# Patient Record
Sex: Female | Born: 1985 | Race: White | Hispanic: No | Marital: Single
Health system: Southern US, Community
[De-identification: ages and names within clinical notes are randomized; demographics above are authoritative.]

---

## 2013-08-30 ENCOUNTER — Emergency Department: Payer: Self-pay | Admitting: Emergency Medicine

## 2013-08-30 LAB — COMPREHENSIVE METABOLIC PANEL
Albumin: 3.8 g/dL (ref 3.4–5.0)
Alkaline Phosphatase: 176 U/L — ABNORMAL HIGH
Anion Gap: 7 (ref 7–16)
BILIRUBIN TOTAL: 0.6 mg/dL (ref 0.2–1.0)
BUN: 8 mg/dL (ref 7–18)
CHLORIDE: 105 mmol/L (ref 98–107)
Calcium, Total: 8.9 mg/dL (ref 8.5–10.1)
Co2: 25 mmol/L (ref 21–32)
Creatinine: 0.77 mg/dL (ref 0.60–1.30)
EGFR (African American): >60
EGFR (Non-African Amer.): >60
Glucose: 103 mg/dL — ABNORMAL HIGH (ref 65–99)
Osmolality: 272 (ref 275–301)
POTASSIUM: 3.3 mmol/L — AB (ref 3.5–5.1)
SGOT(AST): 18 U/L (ref 15–37)
SGPT (ALT): 24 U/L (ref 12–78)
Sodium: 137 mmol/L (ref 136–145)
TOTAL PROTEIN: 7.8 g/dL (ref 6.4–8.2)

## 2013-08-30 LAB — CBC WITH DIFFERENTIAL/PLATELET
Basophil #: 0 10*3/uL (ref 0.0–0.1)
Basophil %: 0.3 %
Eosinophil #: 0.2 10*3/uL (ref 0.0–0.7)
Eosinophil %: 2.2 %
HCT: 39.7 % (ref 35.0–47.0)
HGB: 13.1 g/dL (ref 12.0–16.0)
Lymphocyte #: 3.9 10*3/uL — ABNORMAL HIGH (ref 1.0–3.6)
Lymphocyte %: 34.5 %
MCH: 28.1 pg (ref 26.0–34.0)
MCHC: 33.1 g/dL (ref 32.0–36.0)
MCV: 85 fL (ref 80–100)
Monocyte #: 0.7 x10 3/mm (ref 0.2–0.9)
Monocyte %: 5.9 %
NEUTROS ABS: 6.5 10*3/uL (ref 1.4–6.5)
Neutrophil %: 57.1 %
PLATELETS: 426 10*3/uL (ref 150–440)
RBC: 4.68 10*6/uL (ref 3.80–5.20)
RDW: 13 % (ref 11.5–14.5)
WBC: 11.4 10*3/uL — ABNORMAL HIGH (ref 3.6–11.0)

## 2013-08-30 LAB — URINALYSIS, COMPLETE
BLOOD: NEGATIVE
Bacteria: NONE SEEN
Bilirubin,UR: NEGATIVE
Glucose,UR: NEGATIVE mg/dL (ref 0–75)
KETONE: NEGATIVE
Leukocyte Esterase: NEGATIVE
Nitrite: NEGATIVE
PROTEIN: NEGATIVE
Ph: 6 (ref 4.5–8.0)
RBC,UR: NONE SEEN /HPF (ref 0–5)
Specific Gravity: 1.02 (ref 1.003–1.030)

## 2013-09-09 ENCOUNTER — Ambulatory Visit: Payer: Self-pay | Admitting: Gastroenterology

## 2013-09-20 ENCOUNTER — Ambulatory Visit: Payer: Self-pay | Admitting: Gastroenterology

## 2013-09-21 LAB — PATHOLOGY REPORT

## 2013-09-24 ENCOUNTER — Emergency Department: Payer: Self-pay | Admitting: Internal Medicine

## 2013-09-24 LAB — COMPREHENSIVE METABOLIC PANEL
ALBUMIN: 3.8 g/dL (ref 3.4–5.0)
Alkaline Phosphatase: 167 U/L — ABNORMAL HIGH
Anion Gap: 7 (ref 7–16)
BUN: 6 mg/dL — AB (ref 7–18)
Bilirubin,Total: 0.4 mg/dL (ref 0.2–1.0)
CHLORIDE: 108 mmol/L — AB (ref 98–107)
Calcium, Total: 9 mg/dL (ref 8.5–10.1)
Co2: 25 mmol/L (ref 21–32)
Creatinine: 0.88 mg/dL (ref 0.60–1.30)
EGFR (African American): >60
Glucose: 87 mg/dL (ref 65–99)
OSMOLALITY: 276 (ref 275–301)
Potassium: 3.5 mmol/L (ref 3.5–5.1)
SGOT(AST): 25 U/L (ref 15–37)
SGPT (ALT): 28 U/L (ref 12–78)
Sodium: 140 mmol/L (ref 136–145)
Total Protein: 8 g/dL (ref 6.4–8.2)

## 2013-09-24 LAB — URINALYSIS, COMPLETE
BILIRUBIN, UR: NEGATIVE
BLOOD: NEGATIVE
Glucose,UR: NEGATIVE mg/dL (ref 0–75)
Ketone: NEGATIVE
Nitrite: NEGATIVE
Ph: 7 (ref 4.5–8.0)
Protein: NEGATIVE
Specific Gravity: 1.005 (ref 1.003–1.030)
Squamous Epithelial: 4

## 2013-09-24 LAB — HCG, QUANTITATIVE, PREGNANCY: Beta Hcg, Quant.: <1 m[IU]/mL — ABNORMAL LOW

## 2013-09-24 LAB — CBC WITH DIFFERENTIAL/PLATELET
Basophil #: 0 10*3/uL (ref 0.0–0.1)
Basophil %: 0.4 %
Eosinophil #: 0.2 10*3/uL (ref 0.0–0.7)
Eosinophil %: 2.6 %
HCT: 41.2 % (ref 35.0–47.0)
HGB: 13.5 g/dL (ref 12.0–16.0)
LYMPHS ABS: 2.7 10*3/uL (ref 1.0–3.6)
LYMPHS PCT: 28 %
MCH: 27.7 pg (ref 26.0–34.0)
MCHC: 32.8 g/dL (ref 32.0–36.0)
MCV: 84 fL (ref 80–100)
MONO ABS: 0.8 x10 3/mm (ref 0.2–0.9)
MONOS PCT: 8.1 %
NEUTROS ABS: 5.8 10*3/uL (ref 1.4–6.5)
Neutrophil %: 60.9 %
PLATELETS: 449 10*3/uL — AB (ref 150–440)
RBC: 4.88 10*6/uL (ref 3.80–5.20)
RDW: 13.3 % (ref 11.5–14.5)
WBC: 9.5 10*3/uL (ref 3.6–11.0)

## 2013-09-24 LAB — LIPASE, BLOOD: Lipase: 182 U/L (ref 73–393)

## 2014-07-23 NOTE — Consult Note (Signed)
PATIENT NAME:  Erika Jackson, Erika Jackson MR#:  [id number] DATE OF BIRTH:  26-Oct-1985  DATE OF CONSULTATION:  09/24/2013  CONSULTING PHYSICIAN:  Shaune PollackQing Chen, MD  PRIMARY CARE PHYSICIAN: Nonlocal.  REFERRING PHYSICIAN: Dr. Mindi JunkerGottlieb.  REASON FOR CONSULTATION: Abdominal pain, 3 weeks, worsening today.  REVIEW OF HISTORY: The patient is a 29 year old Caucasian female with a history of endometriosis who presented to the ED with abdominal pain for 3 weeks, worsening today. The patient is alert, awake, oriented, in no acute distress. According to the patient, the patient has had abdominal pain in the right lower quadrant for the past 3 weeks with some nausea, vomiting. The abdominal pain has been intermittent, dull, sometimes sharp, sometimes radiation to back. The patient denies any diarrhea. The patient got an endoscopy and a colonoscopy by Dr. Bluford Kaufmannh recently. The patient denies any fever or chills. No headache or dizziness. So far, according to Dr. Sanjuana LettersGottlieb's workup so far are negative. Since the patient has intractable abdominal pain, hospitalist is requested for consult.  PAST MEDICAL HISTORY: Endometriosis.   SURGICAL HISTORY: Appendectomy and laparoscopy for endometriosis.   SOCIAL HISTORY: No smoking, alcohol abuse, or illicit drugs.   FAMILY HISTORY: Father had a heart attack. Mother and grandmother had endometriosis.   ALLERGIES: TO AMOXICILLIN, BACTRIM.   HOME MEDICATIONS:  1. Singulair 10 mg p.o. daily.  2. Zofran 4 mg every 8 hours p.r.n. 3. Meloxicam 15 mg p.o. once a day p.r.n. 4. Nexplanon 68 mg subcutaneous once a day for 3 years.  5. Percocet 325 mg/5 mg p.o. every 6 hours.   REVIEW OF HISTORY: CONSTITUTIONAL: Patient denies any fever or chills. No headache or dizziness. No weakness.  EYES: No double vision, blurred vision.  ENT: No postnasal drip, slurred speech, or dysphagia.  CARDIOVASCULAR: No chest pain, palpitation, orthopnea, or nocturnal dyspnea, no leg edema.  PULMONARY: No  cough, sputum, shortness of breath, or hematemesis.  GASTROINTESTINAL: Positive for abdominal pain in the right lower quadrant with nausea, vomiting. No diarrhea. No melena or bloody stool.  GENITOURINARY: No dysuria, hematuria, or incontinence.  SKIN: No rash or jaundice.  NEUROLOGY: No syncope, loss of consciousness, or seizure.  ENDOCRINOLOGY: No polyuria, polydipsia, heat or cold intolerance.  HEMATOLOGY: No easy bleeding or bleeding.   PHYSICAL EXAMINATION:  VITAL SIGNS: Temperature 98.7, blood pressure 123/74, pulse 95, oxygen saturation 100% on room air.  GENERAL: The patient is alert, awake, oriented, in no acute distress.  EYES: Pupils round, equal and reactive to light and accommodation.  NECK: Supple. No JVD or carotid bruit. No lymphadenopathy. No thyromegaly.  ENT: Moist oral mucosa. Clear pharynx. CARDIOVASCULAR: S1, S2 regular rate and rhythm. No murmurs or gallops.  PULMONARY: Bilateral air entry. No wheezing or rales. No use of accessory muscle to breathe.  ABDOMEN: Soft, obese. No distention. There is tenderness on the right lower quadrant. No rigidity, no rebound. No organomegaly. Bowel sounds present.  EXTREMITIES: No edema, clubbing or cyanosis. No calf tenderness. Bilateral pedal pulses present. SKIN: No rash, jaundice. NEUROLOGY: A and O x 3. No focal deficit. Power 5/5, sensory intact.    LABORATORY DATA:  1. Femur x-ray shows small focus of peritoneal new bone formation along with medial shaft of the left femur. Most suggestive of stress reaction.  2. Pelvis ultrasound showed normal pelvic ultrasound. 3. Abdominal and pelvis CAT scan showed no acute abdominal process.  4. Abdominal 3-way negative. No acute cardiopulmonary disease.  5. Urinalysis is negative. CBC in normal range. Glucose 87, BUN 6,  creatinine 0.88. Electrolytes are normal. Lipase 182, hCG less than one.   IMPRESSIONS:  1. Intractable abdominal pain, possibly due to endometriosis.  2. Obesity.    RECOMMENDATIONS: Since the patient is having intractable abdominal pain, Dr. Mindi Junker called hospitalist for admission. I examined the patient and discussed the patient's condition and plan of treatment with the patient. I suggested pain control and a gynecological consult because the intractable abdominal pain is possibly related to endometriosis. The patient changed her mind. She wants to go home and follow up with gynecology as an outpatient. Dr. Mindi Junker requested a consult. I discussed the patient's condition and plan of treatment with the patient and the patient's family member. Discussed with Dr. Mindi Junker.   TIME SPENT: About 50 minutes.     ____________________________ Shaune Pollack, MD qc:lt D: 09/24/2013 21:29:00 ET T: 09/25/2013 04:54:40 ET JOB#: [id number]  cc: Shaune Pollack, MD, <Dictator> Shaune Pollack MD ELECTRONICALLY SIGNED 09/28/2013 11:52

## 2014-07-23 NOTE — H&P (Signed)
   Subjective/Chief Complaint RLQ pain x 1 month, Bloating, H/o endometriosis s/p laparoscopy, s/p appendectomy   History of Present Illness Ms. Erika Jackson is a pleasant 29 yo F with a history of what she describes as extensive endometriosis s/p laparoscopy with removal of implants and controlled with hormonal implants and s/p appendectomy in 2010 who presents with 1 month of worsening RLQ pain.  Initially presented earlier this month and found to have enlarged LN around TI.  Has had unremarkable colonoscopy and EGD.  Tolerating diet with some nausea.  No constipation/diarrhea.  Initially bloated but now improved.  Feels pain is similar to prior OB related pain   Past History Endometriosis s/p laparoscopy 2008 Appendicitis s/p appendectomy 2010   Past Med/Surgical Hx:  seasonal allergies:   endometriosis:   ALLERGIES:  Amoxicillin: Rash  Bactrim: Swelling  Family and Social History:  Family History Coronary Artery Disease  Cancer  Stroke   Social History negative tobacco, positive ETOH, Social EtOH   Place of Living Home   Review of Systems:  Subjective/Chief Complaint RLQ pain   Fever/Chills No   Cough No   Sputum No   Abdominal Pain Yes   Diarrhea No   Constipation No   Nausea/Vomiting No   SOB/DOE No   Chest Pain No   Dysuria No   Tolerating Diet Yes   Physical Exam:  GEN well developed, well nourished, no acute distress   HEENT pink conjunctivae, pale conjunctivae, PERRL   RESP normal resp effort  clear BS  no use of accessory muscles   CARD regular rate  no murmur  no thrills   ABD positive tenderness  soft  distended  normal BS  Mild RLQ tenderness, no peritonitis   LYMPH negative neck, negative axillae   SKIN normal to palpation, No rashes, No ulcers   NEURO cranial nerves intact, negative rigidity, negative tremor   PSYCH A+O to time, place, person, good insight    Assessment/Admission Diagnosis Ms. Erika Jackson is a pleasant 29 yo with RLQ  pain and negative workup.  H/o appendectomy and no CT findings consistent with stump appendicitis (no WBC also).  My inclinination is that this pain may be obstetric related with history of OB related pain.   Plan No acute surgical issues.   Electronic Signatures: Jarvis NewcomerLundquist, Zhion Pevehouse A (MD)  (Signed 26-Jun-15 20:38)  Authored: CHIEF COMPLAINT and HISTORY, PAST MEDICAL/SURGIAL HISTORY, ALLERGIES, FAMILY AND SOCIAL HISTORY, REVIEW OF SYSTEMS, PHYSICAL EXAM, ASSESSMENT AND PLAN   Last Updated: 26-Jun-15 20:38 by Jarvis NewcomerLundquist, Eesha Schmaltz A (MD)

## 2015-01-31 IMAGING — NM NUCLEAR MEDICINE WHOLE BODY BONE SCINTIGRAPHY
2 series · 4 of 4 positions shown · non-contrast
Comparison: CT abdomen/pelvis 08/30/2013.

CLINICAL DATA: Generalized joint pain with abdominal and bone pain.
Remote history left elbow fracture. Recent CT demonstrates a few
tiny sclerotic foci over the left pelvis in bilateral femoral heads.

EXAM:
NUCLEAR MEDICINE WHOLE BODY BONE SCAN
TECHNIQUE: Whole body anterior and posterior images were obtained approximately
3 hours after intravenous injection of radiopharmaceutical.
RADIOPHARMACEUTICALS:  22.25 mCi Bechnetium-II MDP

[Series 1000: statics · 2.40mm/px · 2 of 2 frames shown]
[frame 1/2]
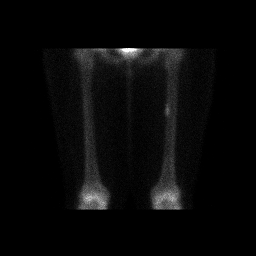
[frame 2/2]
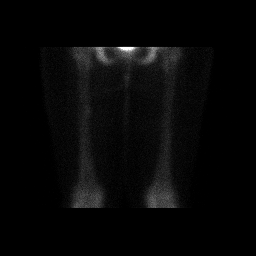

[Series 1000: 3 hr wholebody · 2.40mm/px · 2 of 2 frames shown]
[frame 1/2]
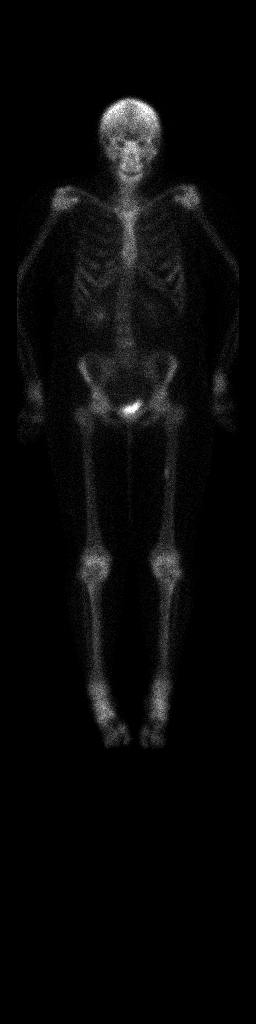
[frame 2/2]
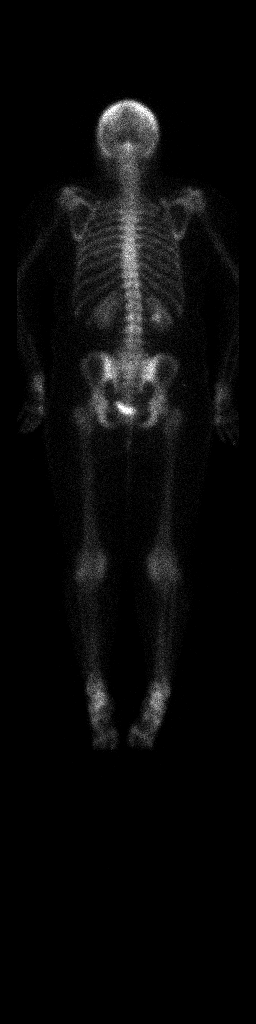

[4 of 4 positions shown; findings below may reference images not displayed]

FINDINGS: Examination demonstrates normal biodistribution of radiotracer
uptake within the bones, soft tissues and genitourinary system.
There is a single mild increased focus of radiotracer activity over
the medial cortex of the left mid femoral diaphysis on the anterior
view. There is no abnormal uptake over the pelvis/ hips. Remainder
of the exam is unremarkable.
IMPRESSION: No definite evidence metastatic disease. Small sclerotic foci over
the left pelvis and bilateral femoral heads are compatible with bone
islands.

Nonspecific single focus of mild increased radiotracer activity over
the medial cortex of the left mid femoral diaphysis. Recommend plain
film correlation as findings may be related to trauma/stress
reaction, infection or neoplastic disease.

## 2015-02-15 IMAGING — US TRANSABDOMINAL ULTRASOUND OF PELVIS
1 series · 14 of 25 positions shown · non-contrast
Comparison: August 30, 2013.

CLINICAL DATA: Pelvic pain.

EXAM:
TRANSABDOMINAL ULTRASOUND OF PELVIS
TECHNIQUE: Transabdominal ultrasound examination of the pelvis was performed
including evaluation of the uterus, ovaries, adnexal regions, and
pelvic cul-de-sac.

[Series 1: transabdominal ultrasound of pelvis · 0.27mm/px · 14 of 30 slices shown]
[im 1/30]
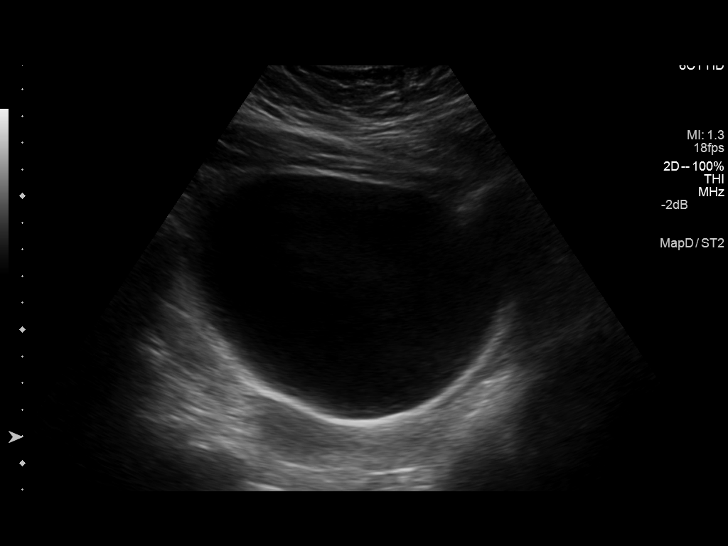
[im 3/30]
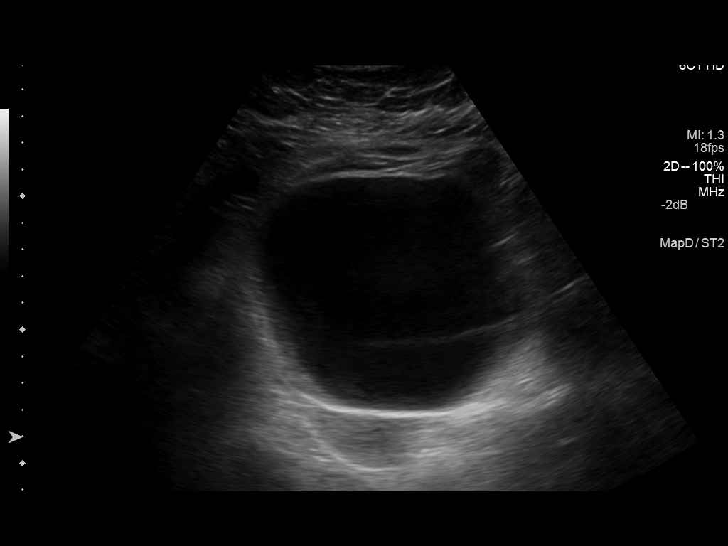
[im 5/30]
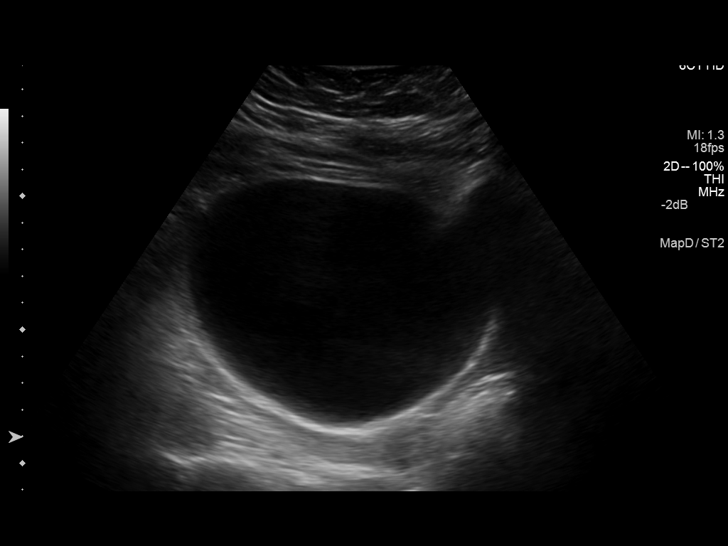
[im 8/30]
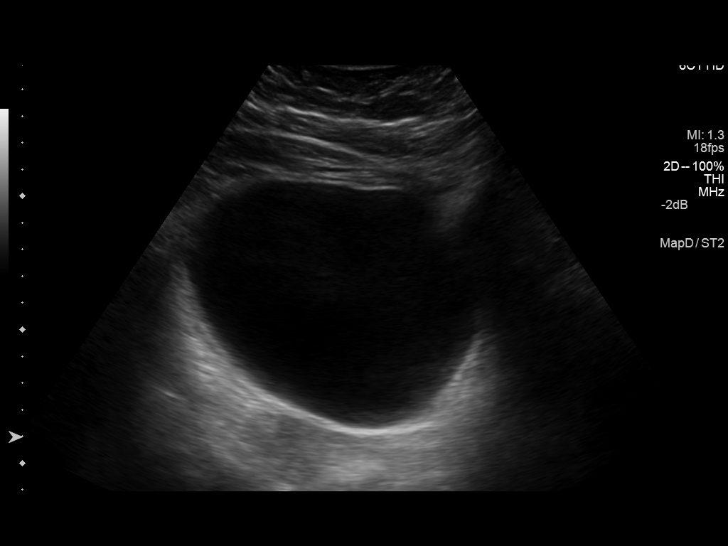
[im 10/30]
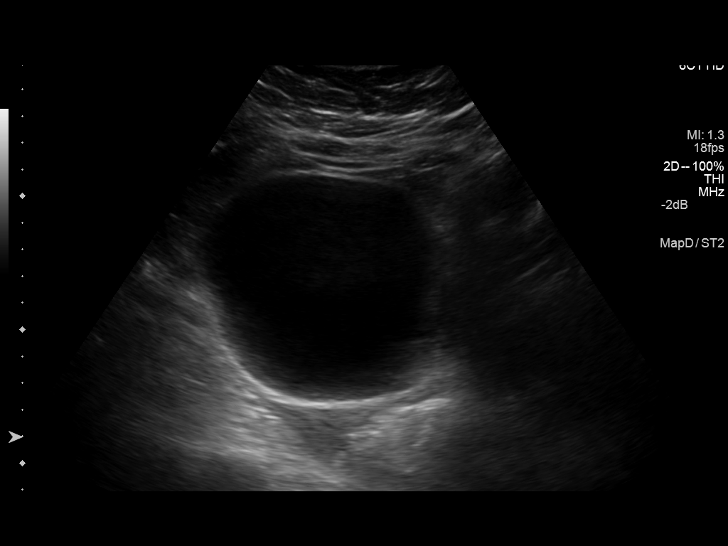
[im 11/30]
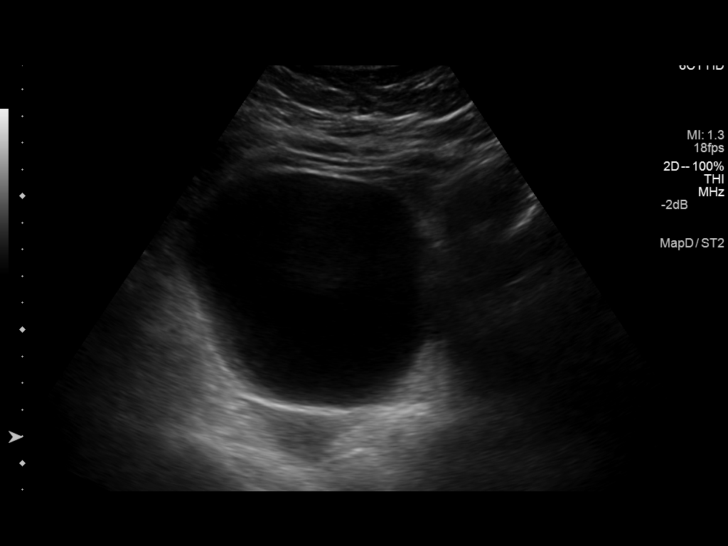
[im 14/30]
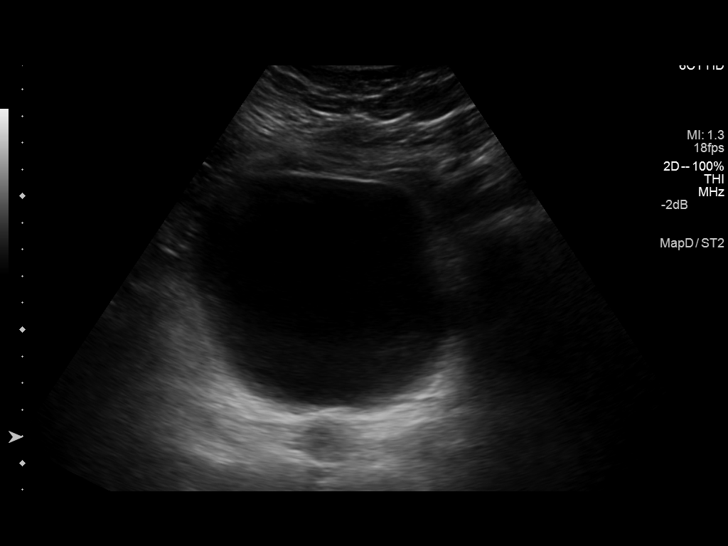
[im 16/30]
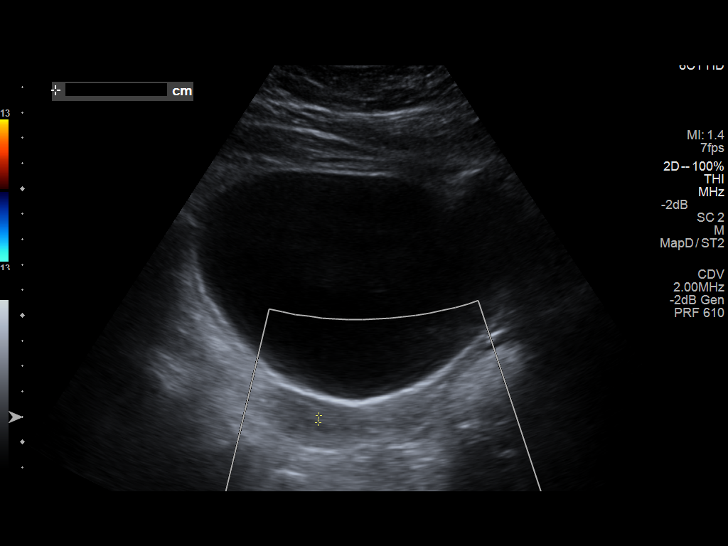
[im 19/30]
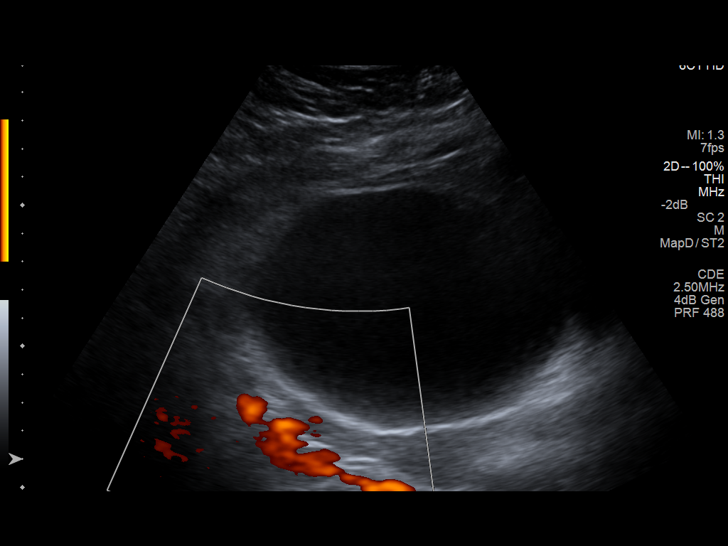
[im 20/30]
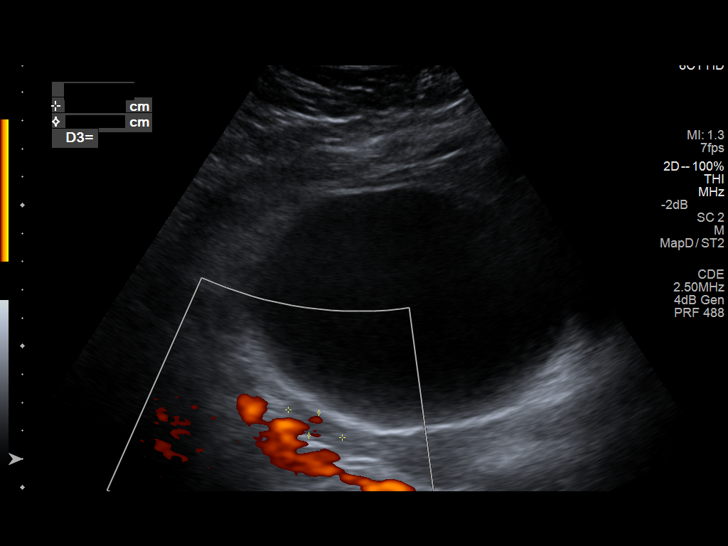
[im 22/30]
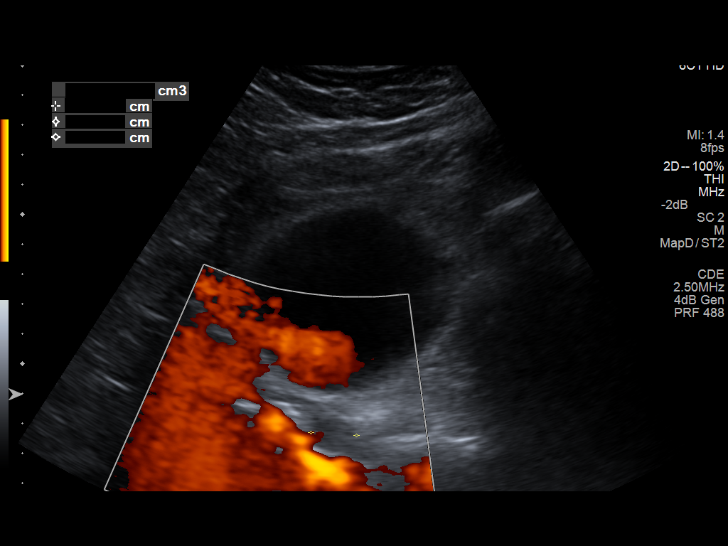
[im 25/30]
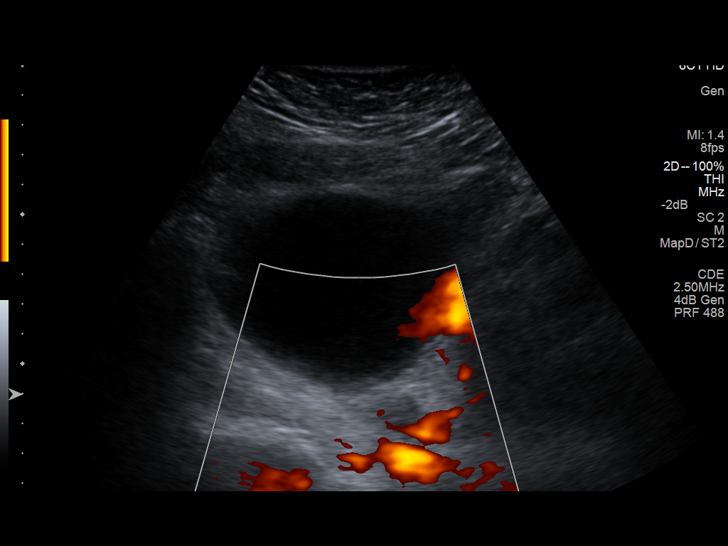
[im 27/30]
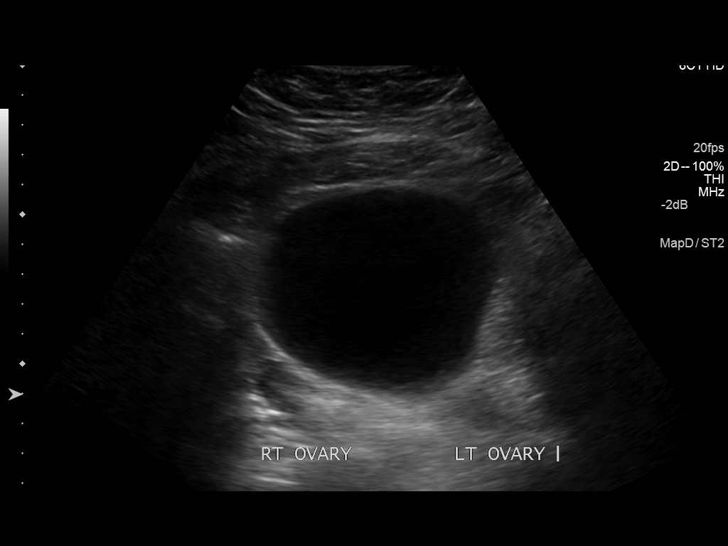
[im 30/30]
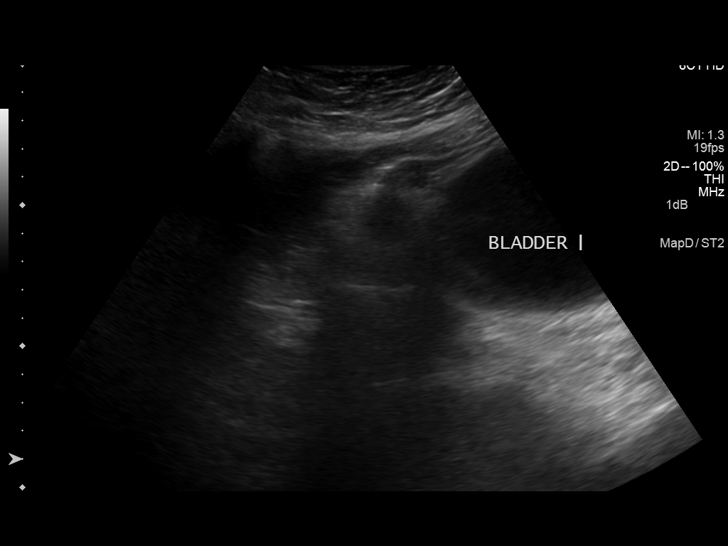

[14 of 25 positions shown; findings below may reference images not displayed]

FINDINGS: Uterus

Measurements: 6.9 x 3.2 x 1.7 cm. No fibroids or other mass
visualized.

Endometrium

Thickness: 3.2 mm.  No focal abnormality visualized.

Right ovary

Measurements: 2.2 x 1.5 x 0.9 cm. Normal appearance/no adnexal mass.

Left ovary

Measurements: 2.2 x 1.4 x 1.3 cm. Normal appearance/no adnexal mass.

Other findings:  No free fluid
IMPRESSION: Normal pelvic ultrasound.

## 2015-02-15 IMAGING — CR DG ABDOMEN 3V
1 series · 3 of 3 positions shown · non-contrast
Comparison: CT 08/30/2013

CLINICAL DATA: Right lower quadrant pain.  Nausea.

EXAM:
ABDOMEN SERIES

[Series 1: w chest pa · 0.14mm/px · 3 of 3 slices shown]
[im 1/3]
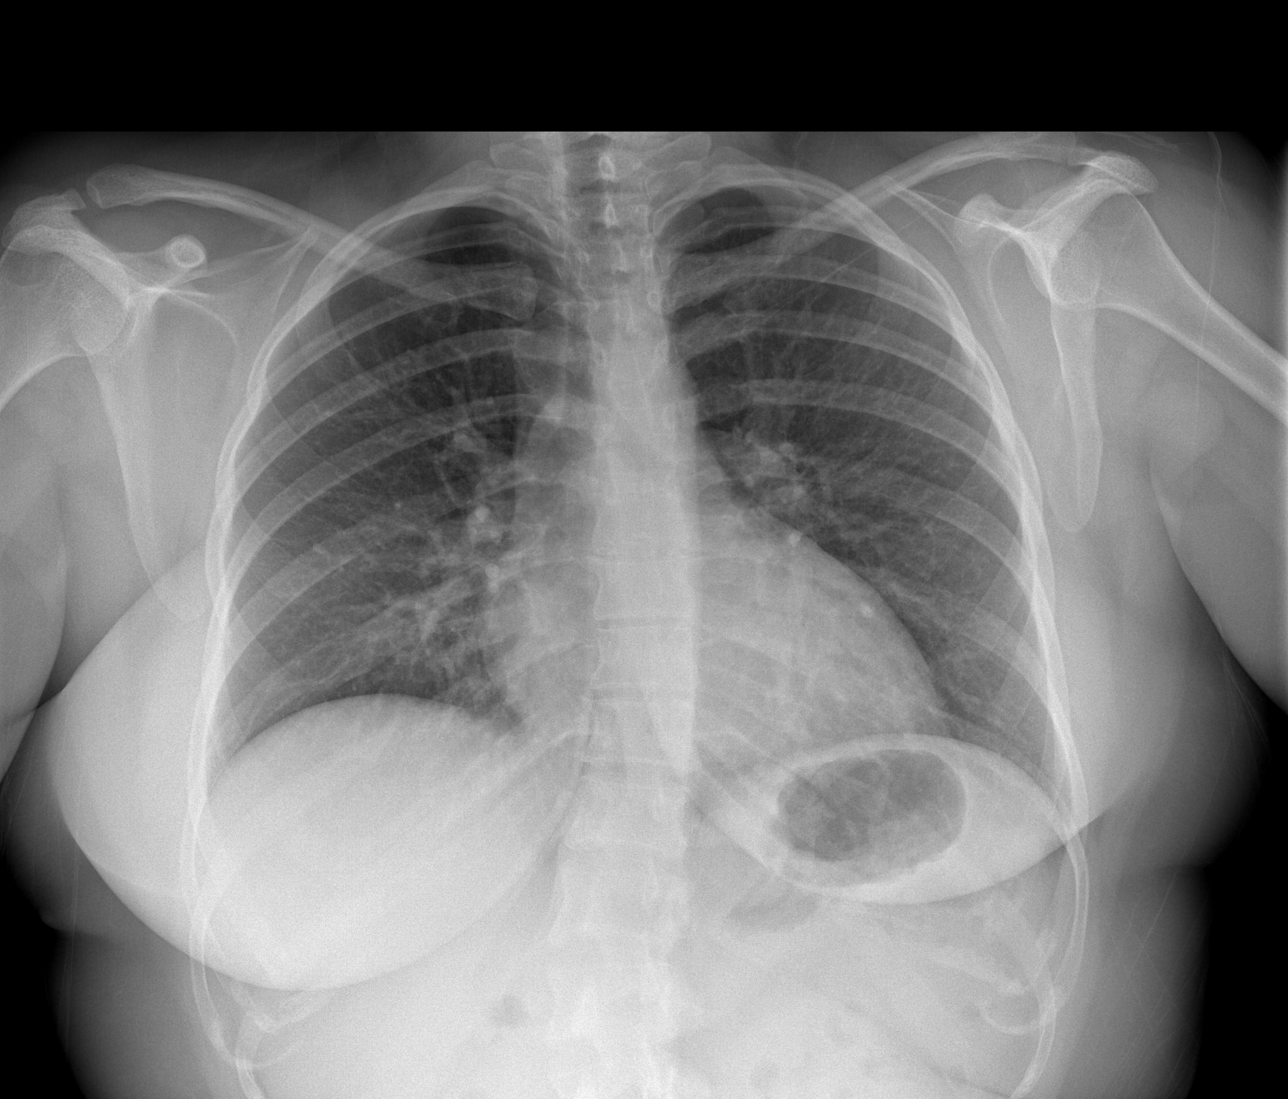
[im 2/3]
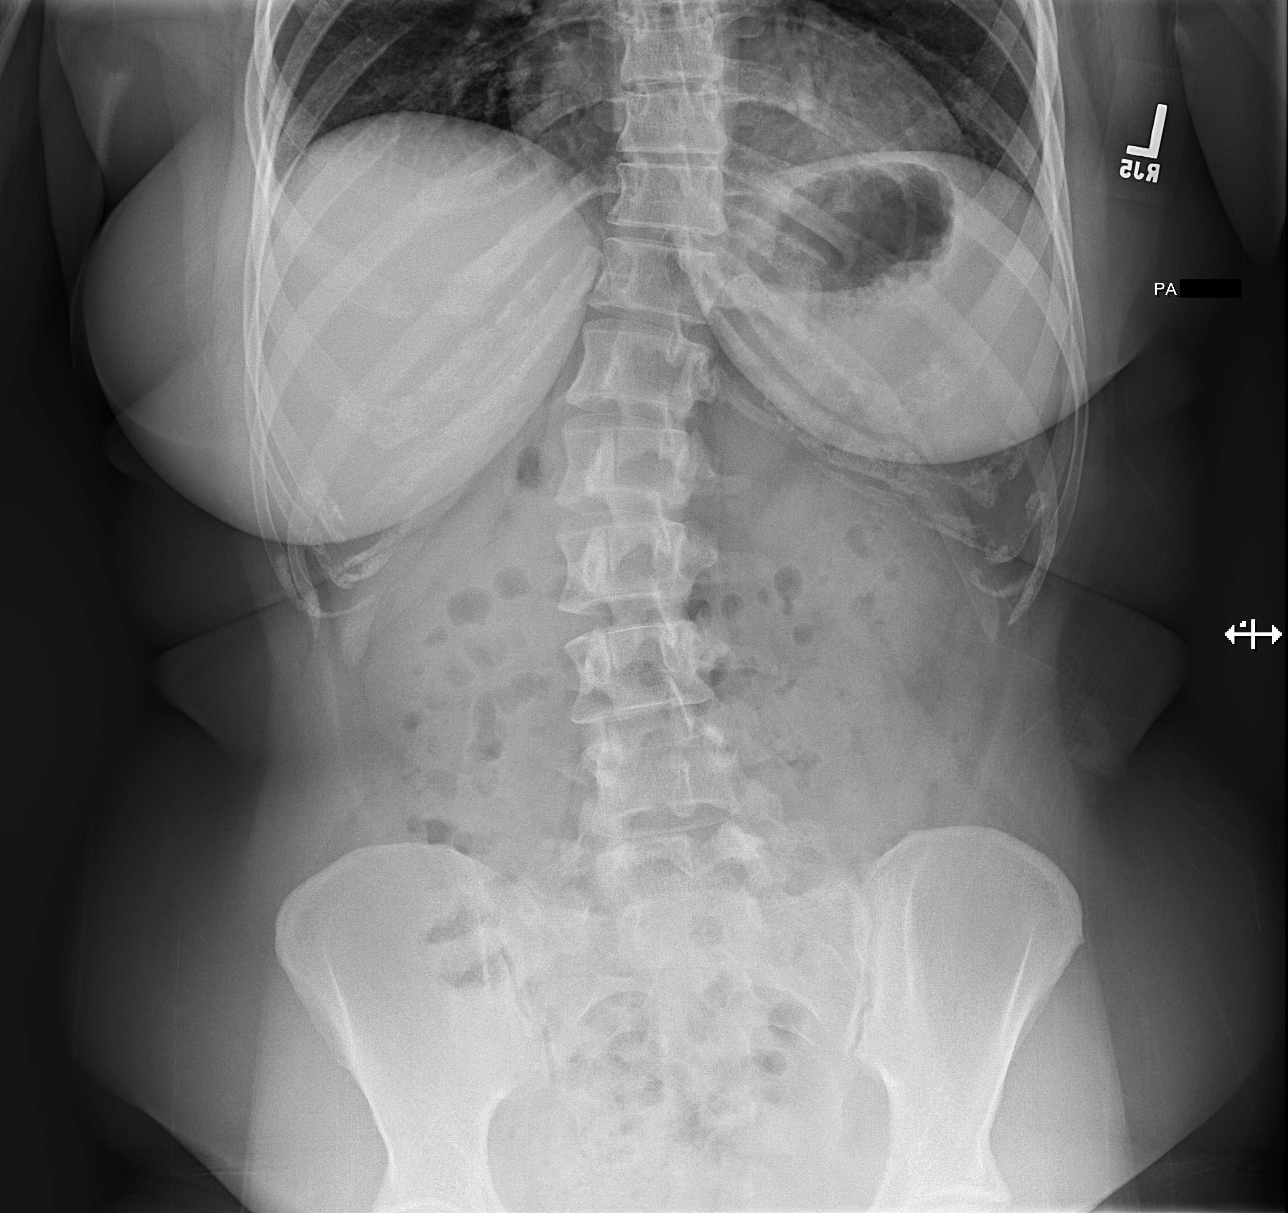
[im 3/3]
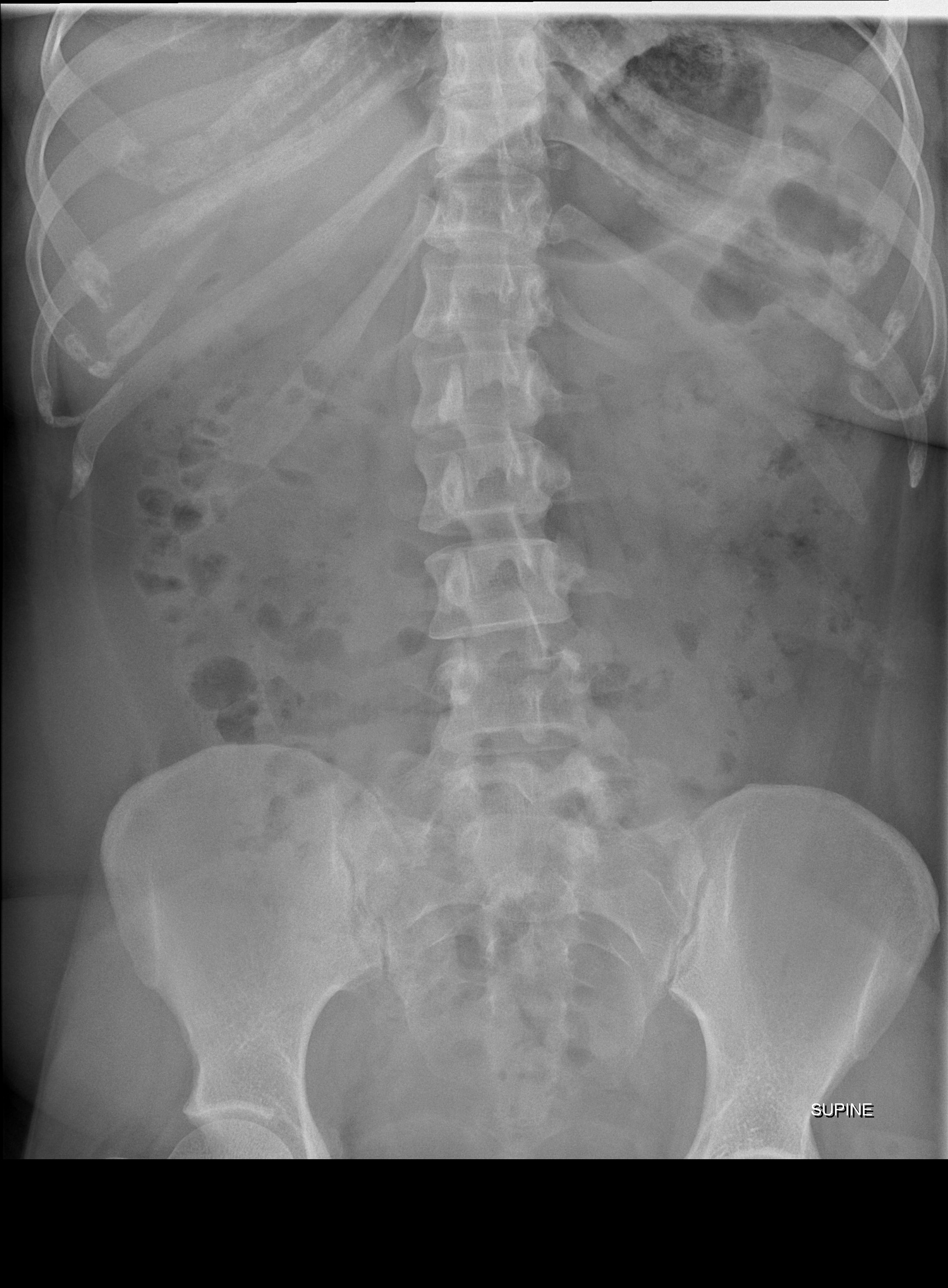

[3 of 3 positions shown; findings below may reference images not displayed]

FINDINGS: Lungs are somewhat hypoinflated without consolidation or effusion.
Cardiomediastinal silhouette and remainder of the thorax is
unremarkable

Abdominal images demonstrate a nonspecific, nonobstructive bowel gas
pattern. There is no free air. There subtle biphasic curvature of
the thoracolumbar spine.
IMPRESSION: Negative abdominal radiographs.  No acute cardiopulmonary disease.

## 2015-02-15 IMAGING — CR DG FEMUR 2V*L*
1 series · 4 of 4 positions shown · non-contrast
Comparison: Bone scan 09/09/2013

CLINICAL DATA: Abnormal bone scan.

EXAM:
LEFT FEMUR - 2 VIEW

[Series 4: t femur proximal ap left · 0.14mm/px · 4 of 4 slices shown]
[im 1/4]
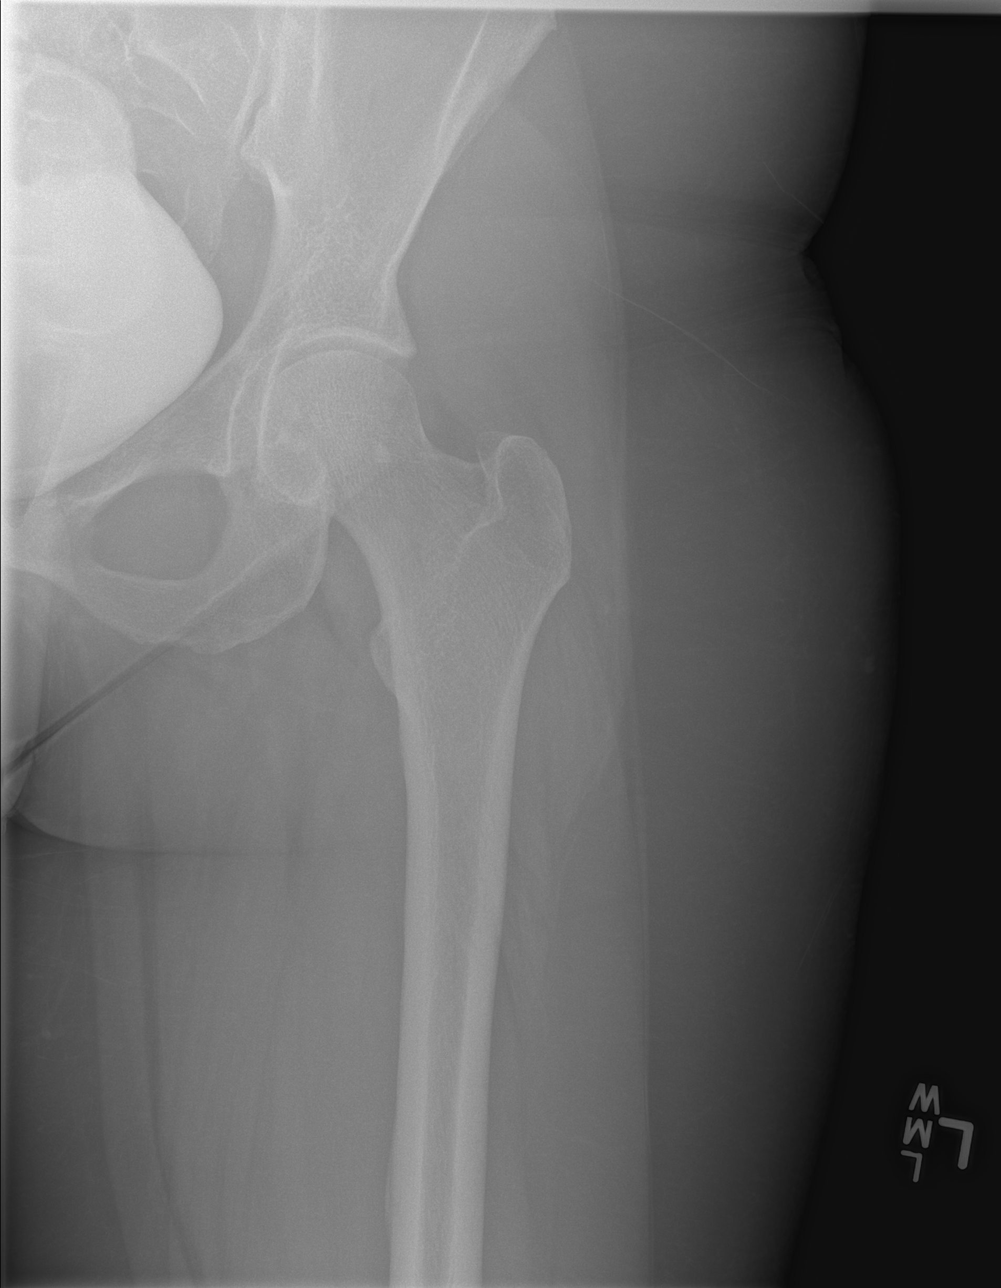
[im 2/4]
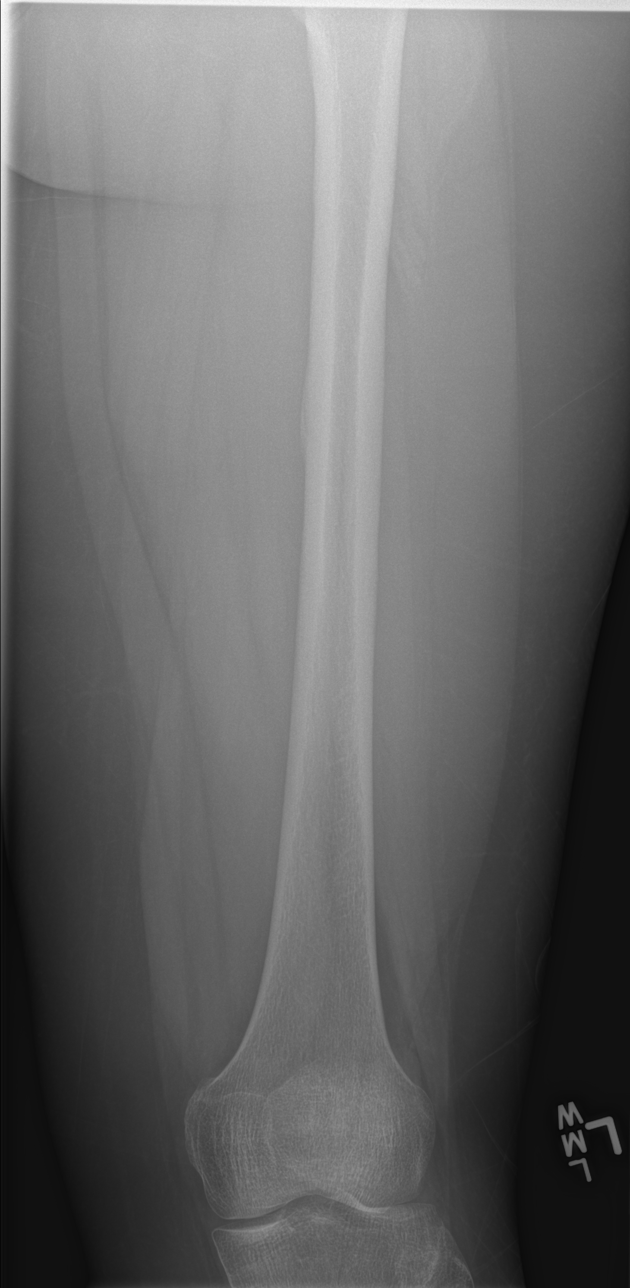
[im 3/4]
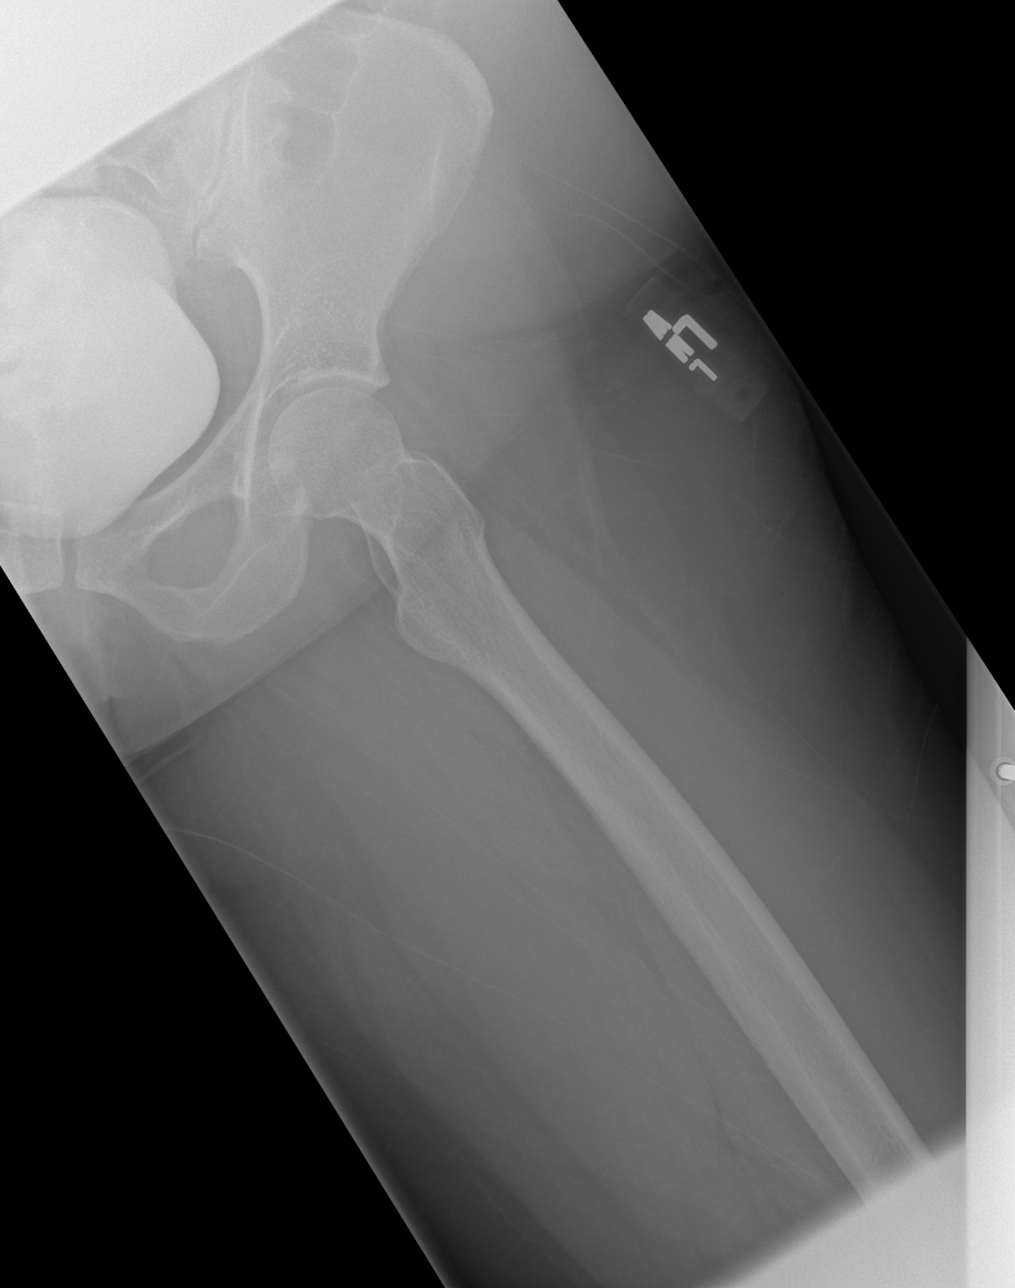
[im 4/4]
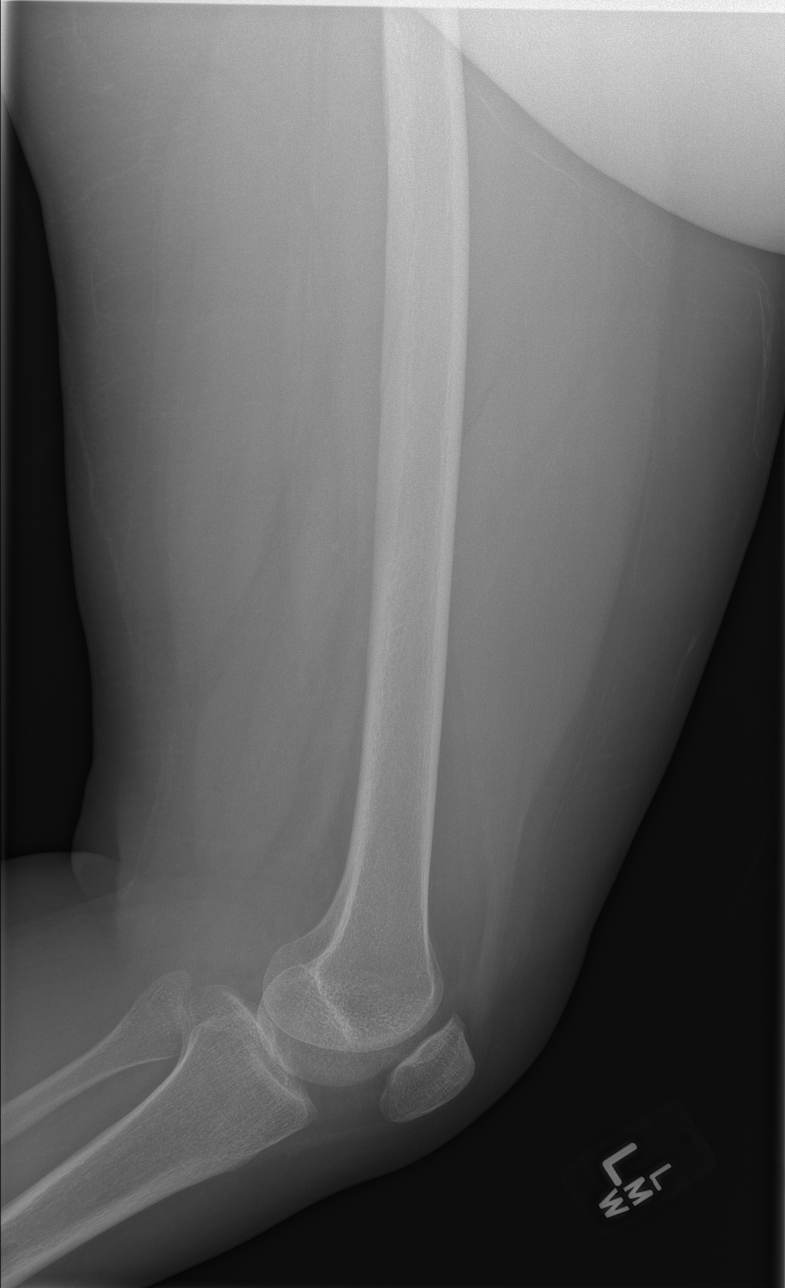

[4 of 4 positions shown; findings below may reference images not displayed]

FINDINGS: Along the medial aspect of the midshaft of the left femur
corresponding to the abnormality on bone scan is a small region of
benign-appearing periosteal new bone formation. No frank fracture is
identified. Femur otherwise appears unremarkable aside from a few
small sclerotic foci in the femoral head, which most likely
represent bone islands. Excreted contrast material is partially
visualized in the bladder.
IMPRESSION: Small focus of periosteal new bone formation along the medial
midshaft of the left femur, most suggestive of stress reaction.
